# Patient Record
Sex: Female | Born: 1985 | Race: White | Hispanic: No | Marital: Single | Smoking: Never smoker
Health system: Southern US, Community
[De-identification: ages and names within clinical notes are randomized; demographics above are authoritative.]

## PROBLEM LIST (undated history)

## (undated) DIAGNOSIS — D649 Anemia, unspecified: Secondary | ICD-10-CM

## (undated) HISTORY — PX: WISDOM TOOTH EXTRACTION: SHX21

## (undated) HISTORY — DX: Anemia, unspecified: D64.9

---
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
## 2006-09-05 ENCOUNTER — Other Ambulatory Visit: Admission: RE | Admit: 2006-09-05 | Discharge: 2006-09-05 | Payer: Self-pay | Admitting: Obstetrics and Gynecology

## 2006-11-25 ENCOUNTER — Encounter: Admission: RE | Admit: 2006-11-25 | Discharge: 2006-11-25 | Payer: Self-pay | Admitting: Family Medicine

## 2007-09-08 ENCOUNTER — Other Ambulatory Visit: Admission: RE | Admit: 2007-09-08 | Discharge: 2007-09-08 | Payer: Self-pay | Admitting: Obstetrics and Gynecology

## 2008-02-16 IMAGING — US US ABDOMEN COMPLETE
1 series · 14 of 25 positions shown · non-contrast
Comparison: none

CLINICAL DATA: 20-year-old female with right upper quadrant and post prandial abdominal pain.
ABDOMEN ULTRASOUND:
TECHNIQUE: Complete abdominal ultrasound examination was performed including evaluation of the liver, gallbladder, bile ducts, pancreas, kidneys, spleen, IVC, and abdominal aorta.
There is no evidence of gallstones or biliary ductal dilatation.  The liver is within normal limits in echogenicity, and no focal liver lesions are seen.  The visualized portions of the IVC and pancreas are unremarkable.  There is no evidence of splenomegaly.  The kidneys are unremarkable, and there is no evidence of hydronephrosis.  The abdominal aorta is nondilated.

[Series 1: unknown · 0.25mm/px · 14 of 54 slices shown]
[im 1/54]
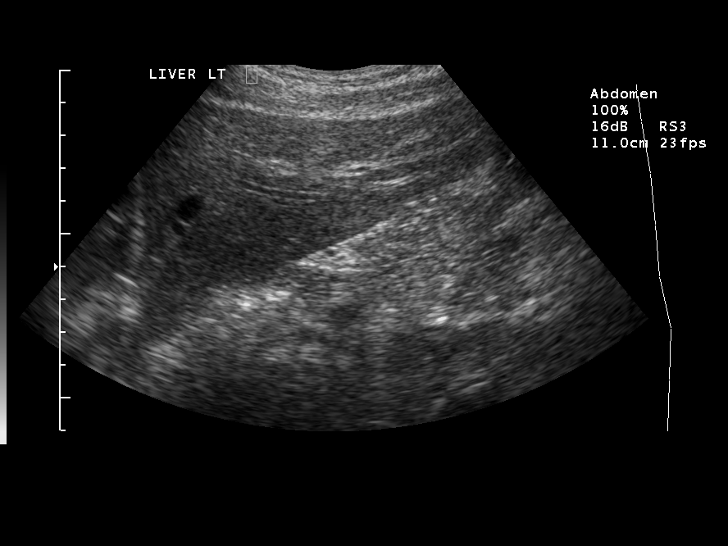
[im 5/54]
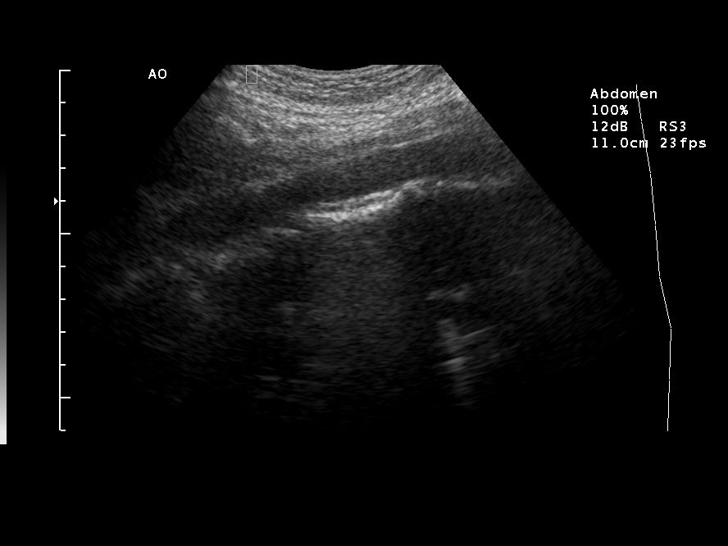
[im 9/54]
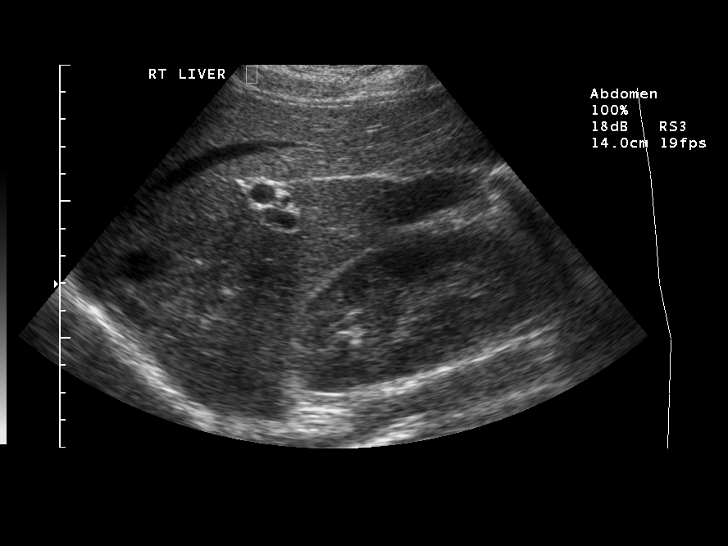
[im 14/54]
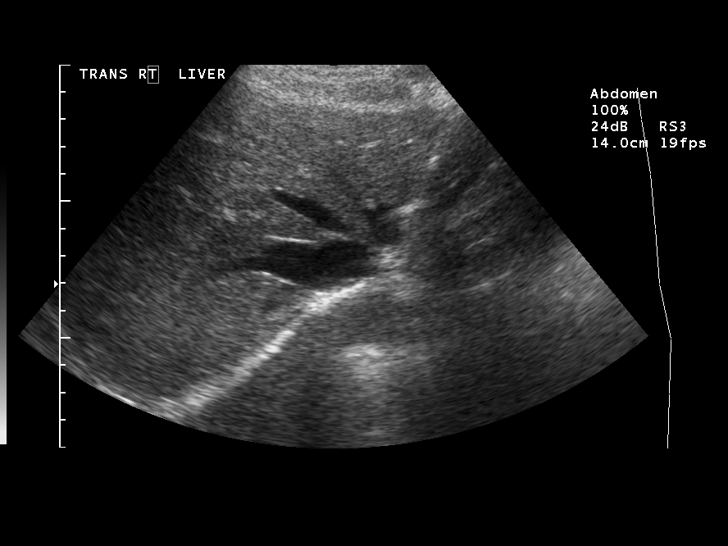
[im 18/54]
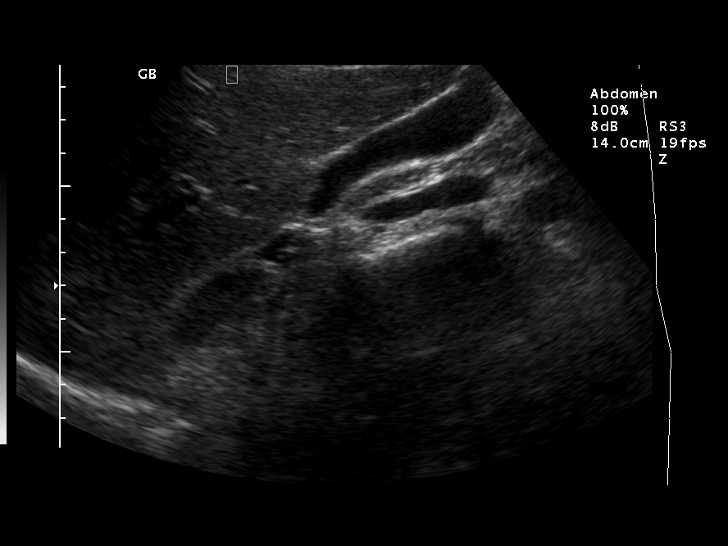
[im 20/54]
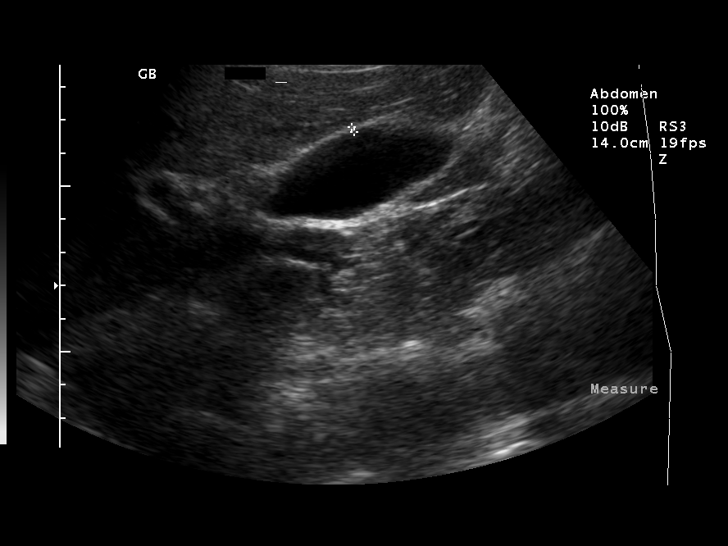
[im 25/54]
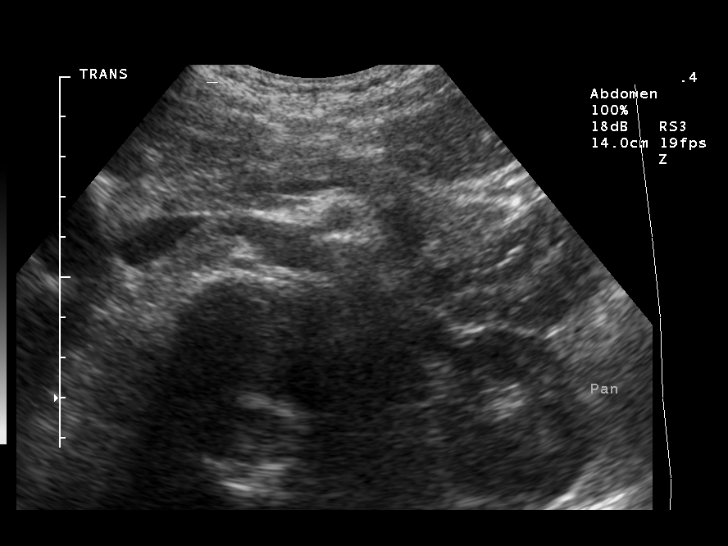
[im 29/54]
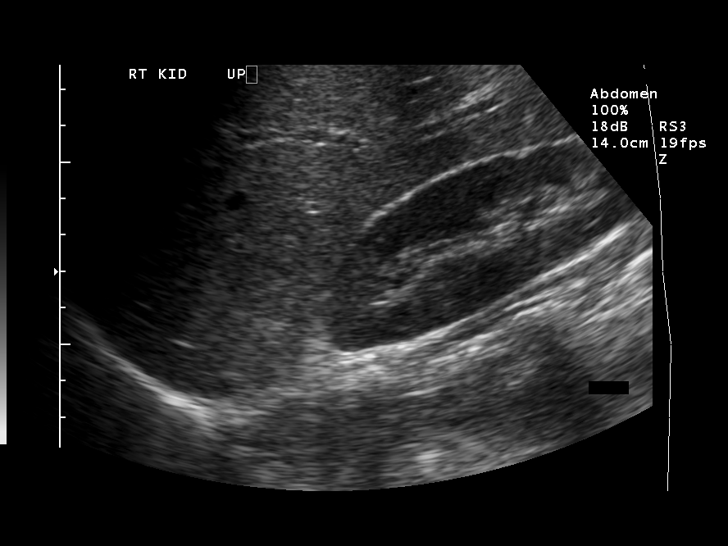
[im 34/54]
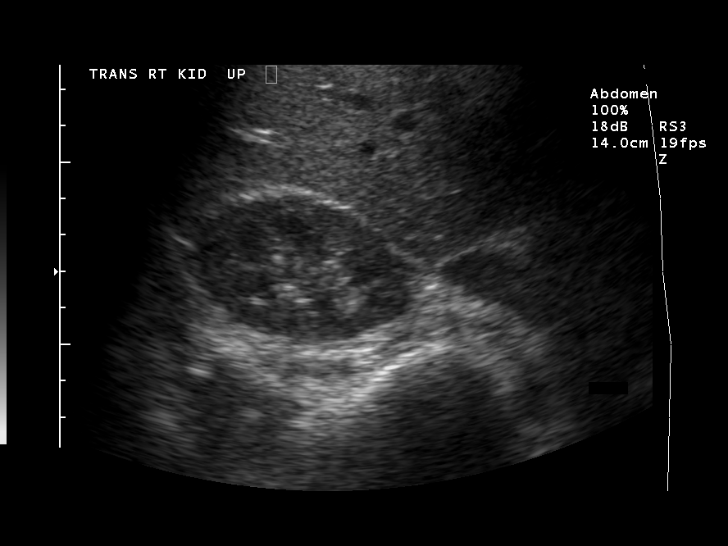
[im 36/54]
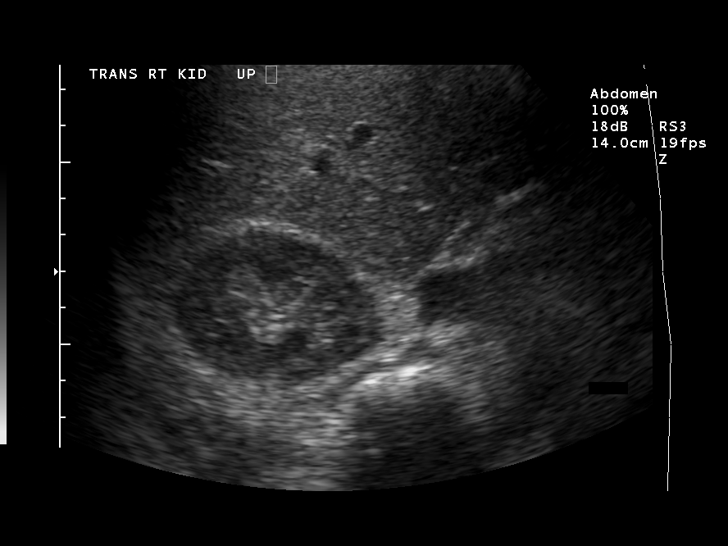
[im 40/54]
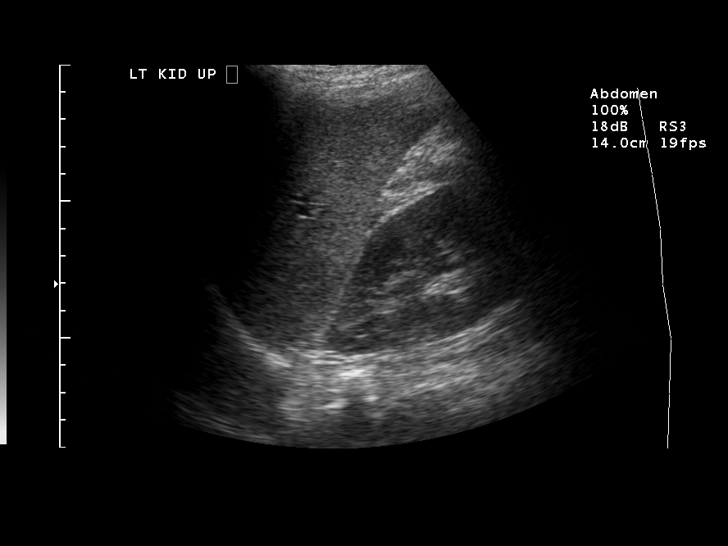
[im 45/54]
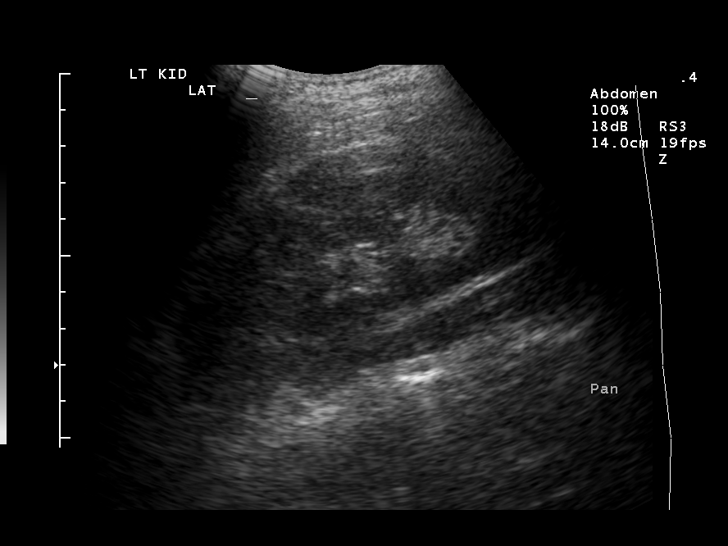
[im 49/54]
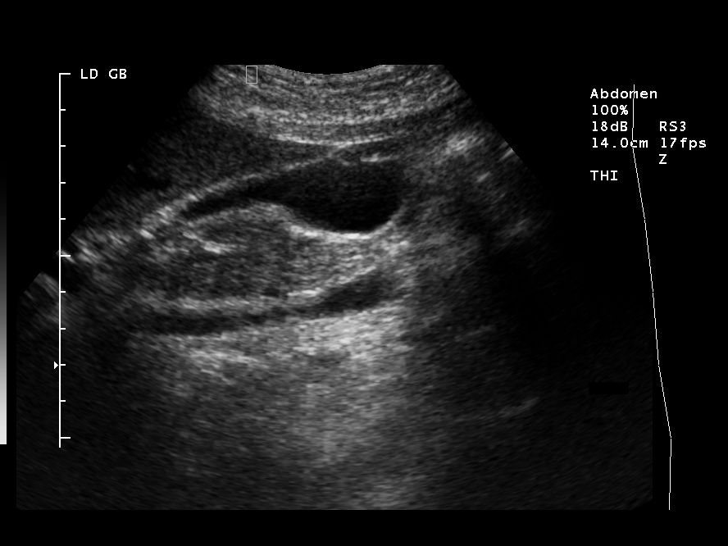
[im 54/54]
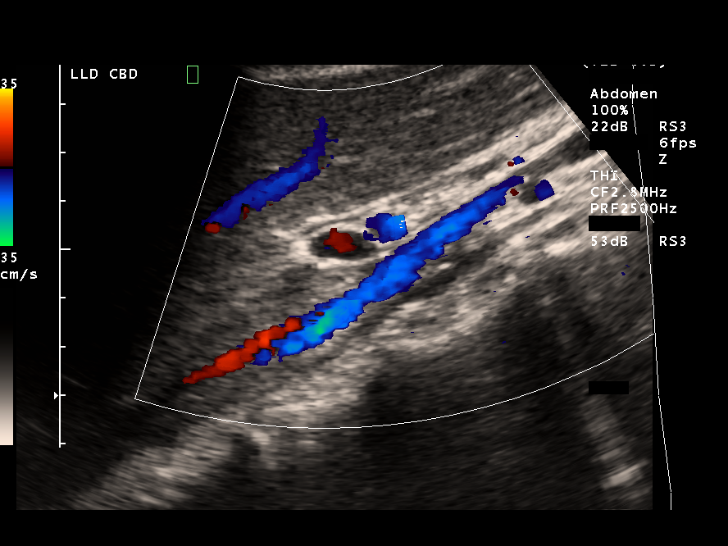

[14 of 25 positions shown; findings below may reference images not displayed]

IMPRESSION: Negative abdominal ultrasound.

## 2012-11-19 ENCOUNTER — Telehealth: Payer: Self-pay | Admitting: Certified Nurse Midwife

## 2013-01-20 ENCOUNTER — Encounter: Payer: Self-pay | Admitting: Nurse Practitioner

## 2013-01-21 ENCOUNTER — Ambulatory Visit (INDEPENDENT_AMBULATORY_CARE_PROVIDER_SITE_OTHER): Payer: BC Managed Care – PPO | Admitting: Obstetrics and Gynecology

## 2013-01-21 ENCOUNTER — Encounter: Payer: Self-pay | Admitting: Obstetrics and Gynecology

## 2013-01-21 VITALS — BP 110/78 | HR 80 | Ht 69.0 in | Wt 135.0 lb

## 2013-01-21 DIAGNOSIS — Z0000 Encounter for general adult medical examination without abnormal findings: Secondary | ICD-10-CM

## 2013-01-21 DIAGNOSIS — Z01419 Encounter for gynecological examination (general) (routine) without abnormal findings: Secondary | ICD-10-CM

## 2013-01-21 LAB — POCT URINALYSIS DIPSTICK
Bilirubin, UA: NEGATIVE
Blood, UA: NEGATIVE
Glucose, UA: NEGATIVE
Ketones, UA: NEGATIVE
Leukocytes, UA: NEGATIVE
Nitrite, UA: NEGATIVE
Protein, UA: NEGATIVE
Urobilinogen, UA: NEGATIVE
pH, UA: 8

## 2013-01-21 MED ORDER — LEVONORGESTREL-ETHINYL ESTRAD 0.1-20 MG-MCG PO TABS: 1.0000 | ORAL_TABLET | Freq: Every day | ORAL | Status: DC

## 2013-01-21 NOTE — Progress Notes (Addendum)
Patient ID: Natalie Hanna, female   DOB: [birth year], 27 y.o.   MRN: [id number] GYNECOLOGY VISIT  PCP:   Alla Feeling  Referring provider:   HPI: 27 y.o.   Single  Caucasian  female   [id number] with Patient's last menstrual period was 01/03/2013.   here for   AEX. Happy with [facility]. Declines STD testing.  Addendum - had negative GC/CT last year with pap.  Monogamous relationship.  Hgb:  Declines Urine:  Neg  GYNECOLOGIC HISTORY: Patient's last menstrual period was 01/03/2013. Sexually active:  yes Partner preference: female Contraception:   OCP's--Lutera and withdrawal. Menopausal hormone therapy: n/a DES exposure:   no Blood transfusions:   no Sexually transmitted diseases:   no GYN Procedures:  no Mammogram:   n/a              Pap:  01-10-12 wnl  History of abnormal pap smear:  no   OB History   Grav Para Term Preterm Abortions TAB SAB Ect Mult Living   1    1            LIFESTYLE: Exercise:      Cross training, weights and cardio         Tobacco:      no Alcohol:         2-3 beers/wine per week Drug use:      no  OTHER HEALTH MAINTENANCE: Tetanus/TDap:  2010 Gardisil:  Completed 2007 Influenza:  occ. Zostavax:  n/a  Bone density:  n/a Colonoscopy:  n/a  Cholesterol check:  never  Family History  Problem Relation Age of Onset  . Diabetes Brother     Type I  . Diabetes Maternal Grandfather     Type II  . Hypertension Maternal Grandfather   . Hyperlipidemia Maternal Grandmother     There are no active problems to display for this patient.  Past Medical History  Diagnosis Date  . Anemia     Past Surgical History  Procedure Laterality Date  . Wisdom tooth extraction      ALLERGIES: Review of patient's allergies indicates no known allergies.  Current Outpatient Prescriptions  Medication Sig Dispense Refill  . levonorgestrel-ethinyl estradiol (AVIANE,ALESSE,LESSINA) 0.1-20 MG-MCG tablet Take 1 tablet by mouth daily.       No current  facility-administered medications for this visit.     ROS:  Pertinent items are noted in HPI.  SOCIAL HISTORY:  [employer] to [employer].  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  Planning to move to [place] next year.  Fluent in [place].  Wants to do mission work in [place].  PHYSICAL EXAMINATION:    BP 110/78  Pulse 80  Ht 5\' 9"  (1.753 m)  Wt 135 lb (61.236 kg)  BMI 19.93 kg/m2  LMP 01/03/2013   Wt Readings from Last 3 Encounters:  01/21/13 135 lb (61.236 kg)     Ht Readings from Last 3 Encounters:  01/21/13 5\' 9"  (1.753 m)    General appearance: alert, cooperative and appears stated age Head: Normocephalic, without obvious abnormality, atraumatic Neck: no adenopathy, supple, symmetrical, trachea midline and thyroid not enlarged, symmetric, no tenderness/mass/nodules Lungs: clear to auscultation bilaterally Breasts: Inspection negative, No nipple retraction or dimpling, No nipple discharge or bleeding, No axillary or supraclavicular adenopathy, Normal to palpation without dominant masses Heart: regular rate and rhythm Abdomen: soft, non-tender; no masses,  no organomegaly Extremities: extremities normal, atraumatic, no cyanosis or edema Skin: Skin color, texture, turgor normal. No  rashes or lesions Lymph nodes: Cervical, supraclavicular, and axillary nodes normal. No abnormal inguinal nodes palpated Neurologic: Grossly normal  Pelvic: External genitalia:  no lesions              Urethra:  normal appearing urethra with no masses, tenderness or lesions              Bartholins and Skenes: normal                 Vagina: normal appearing vagina with normal color and discharge, no lesions              Cervix: normal appearance              Pap and high risk HPV testing done: no.            Bimanual Exam:  Uterus:  uterus is normal size, shape, consistency and nontender                                      Adnexa: normal adnexa in size,  nontender and no masses                                      ASSESSMENT  Normal gynecologic exam.  PLAN  Mammogram age 36.  Pap smear in 2016 Refill of Lutera OCPs.  See Epic.  I recommend yearly flu vaccine.  Return annually or prn   An After Visit Summary was printed and given to the patient.

## 2013-01-21 NOTE — Patient Instructions (Signed)

## 2013-12-27 ENCOUNTER — Telehealth: Payer: Self-pay | Admitting: Obstetrics and Gynecology

## 2013-12-27 MED ORDER — LEVONORGESTREL-ETHINYL ESTRAD 0.1-20 MG-MCG PO TABS: 1.0000 | ORAL_TABLET | Freq: Every day | ORAL | Status: AC

## 2013-12-27 NOTE — Telephone Encounter (Signed)
pharmacy calling to get refill on pt's Aviane medication. [facility] at [phone].

## 2013-12-27 NOTE — Telephone Encounter (Signed)
Last AEX and refill 01/21/13 #3packs/3R No future appt  Rx sent to pharmacy for 1 month. Patient will need AEX for further refills

## 2014-02-14 ENCOUNTER — Encounter: Payer: Self-pay | Admitting: Obstetrics and Gynecology
